# Patient Record
Sex: Female | Born: 1965 | Race: White | Hispanic: No | Marital: Single | Smoking: Former smoker
Health system: Southern US, Community
[De-identification: ages and names within clinical notes are randomized; demographics above are authoritative.]

## PROBLEM LIST (undated history)

## (undated) DIAGNOSIS — G35D Multiple sclerosis, unspecified: Secondary | ICD-10-CM

## (undated) DIAGNOSIS — I10 Essential (primary) hypertension: Secondary | ICD-10-CM

## (undated) DIAGNOSIS — G35 Multiple sclerosis: Secondary | ICD-10-CM

## (undated) DIAGNOSIS — G629 Polyneuropathy, unspecified: Secondary | ICD-10-CM

## (undated) DIAGNOSIS — H539 Unspecified visual disturbance: Secondary | ICD-10-CM

## (undated) HISTORY — PX: ULNAR NERVE TRANSPOSITION: SHX2595

## (undated) HISTORY — PX: OTHER SURGICAL HISTORY: SHX169

## (undated) HISTORY — DX: Polyneuropathy, unspecified: G62.9

## (undated) HISTORY — DX: Essential (primary) hypertension: I10

## (undated) HISTORY — PX: CARPAL TUNNEL RELEASE: SHX101

## (undated) HISTORY — PX: APPENDECTOMY: SHX54

## (undated) HISTORY — DX: Multiple sclerosis, unspecified: G35.D

## (undated) HISTORY — PX: KNEE SURGERY: SHX244

## (undated) HISTORY — DX: Unspecified visual disturbance: H53.9

## (undated) HISTORY — DX: Multiple sclerosis: G35

## (undated) HISTORY — PX: BLEPHAROPLASTY: SUR158

---

## 2011-09-17 DIAGNOSIS — H35359 Cystoid macular degeneration, unspecified eye: Secondary | ICD-10-CM | POA: Insufficient documentation

## 2015-07-04 DIAGNOSIS — Z79899 Other long term (current) drug therapy: Secondary | ICD-10-CM | POA: Insufficient documentation

## 2016-09-26 DIAGNOSIS — G894 Chronic pain syndrome: Secondary | ICD-10-CM | POA: Insufficient documentation

## 2016-10-30 ENCOUNTER — Encounter: Payer: Self-pay | Admitting: Neurology

## 2016-10-30 ENCOUNTER — Ambulatory Visit (INDEPENDENT_AMBULATORY_CARE_PROVIDER_SITE_OTHER): Payer: Medicare HMO | Admitting: Neurology

## 2016-10-30 VITALS — BP 108/64 | HR 68 | Resp 6 | Ht 68.0 in | Wt 179.5 lb

## 2016-10-30 DIAGNOSIS — R32 Unspecified urinary incontinence: Secondary | ICD-10-CM | POA: Insufficient documentation

## 2016-10-30 DIAGNOSIS — G894 Chronic pain syndrome: Secondary | ICD-10-CM | POA: Diagnosis not present

## 2016-10-30 DIAGNOSIS — Z79899 Other long term (current) drug therapy: Secondary | ICD-10-CM

## 2016-10-30 DIAGNOSIS — Z9889 Other specified postprocedural states: Secondary | ICD-10-CM | POA: Diagnosis not present

## 2016-10-30 DIAGNOSIS — F988 Other specified behavioral and emotional disorders with onset usually occurring in childhood and adolescence: Secondary | ICD-10-CM

## 2016-10-30 DIAGNOSIS — R269 Unspecified abnormalities of gait and mobility: Secondary | ICD-10-CM | POA: Diagnosis not present

## 2016-10-30 DIAGNOSIS — G35 Multiple sclerosis: Secondary | ICD-10-CM

## 2016-10-30 DIAGNOSIS — R5383 Other fatigue: Secondary | ICD-10-CM | POA: Insufficient documentation

## 2016-10-30 DIAGNOSIS — Z978 Presence of other specified devices: Secondary | ICD-10-CM | POA: Insufficient documentation

## 2016-10-30 DIAGNOSIS — H35359 Cystoid macular degeneration, unspecified eye: Secondary | ICD-10-CM

## 2016-10-30 MED ORDER — ROLLATOR MISC: 0 refills | Status: AC

## 2016-10-30 MED ORDER — METHYLPHENIDATE HCL 10 MG PO TABS: 10.0000 mg | ORAL_TABLET | Freq: Three times a day (TID) | ORAL | 0 refills | Status: AC

## 2016-10-30 MED ORDER — VITAMIN D (ERGOCALCIFEROL) 1.25 MG (50000 UNIT) PO CAPS: 50000.0000 [IU] | ORAL_CAPSULE | ORAL | 3 refills | Status: AC

## 2016-10-30 NOTE — Progress Notes (Signed)
[facility]  PATIENT: Sherry Vance DOB: Jan 15, 1966  REFERRING DOCTOR OR PCP:  Referred by Donnetta Simpers. PCP is [facility]. SOURCE: patient, notes from Dr. Hale Bogus, imaging and lab reports in PACS  _________________________________   HISTORICAL  CHIEF COMPLAINT:  Chief Complaint  Patient presents with  . Multiple Sclerosis    Sherry Vance is here with her dtr. [name] to transfer care of her MS to [facility].  Sts. she was dx. around 1998.  Dx. confirmed with MRI and LP. Believes she was initially on Avonex, stopped due to skin rxn's.  Thinks she tried Copaxone next, but relapsed and so was switched to Betaseron, but stopped due to progression of sx.  She switched to Aubagio around 2012.  Sts. her last MRI was done in the last few mos. at [facility].  She has cd with her today.  Sts. the sx. that bother  her the most are numbness/heaviness in  . Gait Disturbance    both legs, and burning/stinging bilat hands.  Sts. she is also forgetful.  She is ambulatory with a rolling walker today/fim    HISTORY OF PRESENT ILLNESS:  I had the pleasure seeing you patient, Sherry Vance, at [facility] for neurologic consultation regarding her multiple sclerosis.  In 1998, she had the onset of leg numbness and she saw Dr. Hale Bogus in [place]. MRI of the brain and staff was consistent with MS. She was placed on Copaxone but had skin reactions and switched to Avonex.   She relapsed and went on Betaseron but continued to relapse.   At one point, plasmapheresis was tried for a severe exacerbation.   She went on Novantrone for 2 years around 2004 and stopped when she reached her limit.    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    She has been on Aubagio x several years and feels she has been mostly stable but has noted worsening gait and memory.   She discussed Ocrelizumab with Dr. Hale Bogus but decided against it.     Gait/strength/sensation:   She has  a poor gait and uses a walker.  Around the house, she furniture surfs but uses the She notes tingling and numbness in both hands.     She has spasticity and weakness in both legs and has had a baclofen pump x 14-15 years.   Dose is  930 mcg/day  Vision:  She has difficulty with visio and has bene diagnosed with macular edema.   Due to a persistent sore on hr leg, her ophthalmologist does not want her to take a steroid treatment for the macular edema but one will be considered once it has healed.  Bladder:   She has urinary hesitancy and incontinence.   Ditropan helped a little but Myrbetriq did not.  She feels she does not empty She is scheduled to see urology next week.  Fatigue/sleep:   She is fatigued daily, especially in the afternoons.  She is sleepy in the late mornings and afternoons.  She is on Ritalin LA 30 mg with some benefit.  She sleeps well at night initially but then will wake up several times.   Usually she can fall back asleep.   She snores a little but has no OSA symptoms at night.    Cognition:   She is concerned about worsening, reduced focus short term memory and slowed processing.  This has worsened the past 1-2 years.   Headaches:   She reports headaches  that are in the bilateral temples lasting several minutes.  Pain is sharp and pressure like.  She keeps her eyes closed when pain is present and her daughter notes that she is sometimes disoriented.    She notes her memory is poor x 10-15 minutes with these headaches.    MRI Review:  I reviewed the MRI brain results from 01/23/2016, 05/31/2011 and 03/02/2010 and the cervical spine results from 09/13/2014 and 08/10/2013. She has classic lesions in the periventricular white matter as well as in the infratentorial region consistent with MS. The 2011 MRI showed 2 enhancing lesions. The MRI of the cervical spine shows several foci in the upper cervical spine consistent with MS. None of the foci enhanced.  Patient review the MRI of the  brain dated 05/31/2011. It shows multiple T2/FLAIR hyperintense foci in the periventricular, juxtacortical and deep white matter of both hemispheres. There is also a right middle cerebellar peduncle, right pons, several cerebellar hemispheric foci and a right medullary focus.  There is a small amount of enhancement in the right middle cerebellar peduncle focus.  I personally reviewed the images from the [facility] of the neck and head dated 06/22/2016. They are normal.  Other data review:   I reviewed the 05/17/2016 electromyogram/nerve conduction study. It shows an active L5 radiculopathy on the left.  REVIEW OF SYSTEMS: Constitutional: No fevers, chills, sweats, or change in appetite. Notes fatigue and poor sleep Eyes: No visual changes, double vision, eye pain Ear, nose and throat: No hearing loss, ear pain, nasal congestion, sore throat Cardiovascular: No chest pain, palpitations Respiratory: No shortness of breath at rest or with exertion.   No wheezes GastrointestinaI: No nausea, vomiting, diarrhea, abdominal pain, fecal incontinence Genitourinary: see above. Musculoskeletal: Chronic neck pain, back pain Integumentary: No rash, pruritus, skin lesions Neurological: as above Psychiatric: No depression at this time.  No anxiety.  Has  Attention deficit Endocrine: No palpitations, diaphoresis, change in appetite, change in weigh or increased thirst Hematologic/Lymphatic: No anemia, purpura, petechiae. Allergic/Immunologic: No itchy/runny eyes, nasal congestion, recent allergic reactions, rashes  ALLERGIES: Allergies  Allergen Reactions  . Metoclopramide Other (See Comments)    Blisters in back of throat Pharyngeal blisters    HOME MEDICATIONS:  Current Outpatient Prescriptions:  .  brimonidine-timolol (COMBIGAN) 0.2-0.5 % ophthalmic solution, Place 1 drop into both eyes every 12 (twelve) hours., Disp: , Rfl:  .  cycloSPORINE (RESTASIS) 0.05 % ophthalmic emulsion, , Disp: , Rfl:  .   cycloSPORINE (SANDIMMUNE) 25 MG capsule, Take by mouth., Disp: , Rfl:  .  furosemide (LASIX) 20 MG tablet, Take by mouth., Disp: , Rfl:  .  lisinopril (PRINIVIL,ZESTRIL) 10 MG tablet, take 1 tablet by mouth once daily, Disp: , Rfl:  .  Teriflunomide (AUBAGIO) 14 MG TABS, , Disp: , Rfl:  .  triamcinolone (KENALOG) 0.1 % paste, PLACE ONTO TEETH 2 TIMES DAILY, Disp: , Rfl:  .  methocarbamol (ROBAXIN) 500 MG tablet, Take by mouth., Disp: , Rfl:  .  methylphenidate (RITALIN) 10 MG tablet, Take 1 tablet (10 mg total) by mouth 3 (three) times daily., Disp: 270 tablet, Rfl: 0 .  Misc. Devices (ROLLATOR) MISC, One Rollator or similar walker.  MS (G35) and gait ataxia (R26.9), Disp: 1 each, Rfl: 0 .  Vitamin D, Ergocalciferol, (DRISDOL) 50000 units CAPS capsule, Take 1 capsule (50,000 Units total) by mouth every 7 (seven) days., Disp: 12 capsule, Rfl: 3  PAST MEDICAL HISTORY: Past Medical History:  Diagnosis Date  . Hypertension   .  Multiple sclerosis (HCC)   . Neuropathy (HCC)   . Vision abnormalities     PAST SURGICAL HISTORY: Past Surgical History:  Procedure Laterality Date  . APPENDECTOMY    . BLEPHAROPLASTY Bilateral   . CARPAL TUNNEL RELEASE Right   . implanted intrathecal pump    . KNEE SURGERY Left   . ULNAR NERVE TRANSPOSITION Right     FAMILY HISTORY: Family History  Problem Relation Age of Onset  . COPD Mother   . Diabetes Father     SOCIAL HISTORY:  Social History   Social History  . Marital status: Single    Spouse name: N/A  . Number of children: N/A  . Years of education: N/A   Occupational History  . Not on file.   Social History Main Topics  . Smoking status: Former Games developer  . Smokeless tobacco: Never Used  . Alcohol use No  . Drug use: No  . Sexual activity: Not on file   Other Topics Concern  . Not on file   Social History Narrative  . No narrative on file     PHYSICAL EXAM  Vitals:   10/30/16 1400  BP: 108/64  Pulse: 68  Resp: (!) 6    Weight: 179 lb 8 oz (81.4 kg)  Height: 5\' 8"  (1.727 m)    Body mass index is 27.29 kg/m.   General: The patient is well-developed and well-nourished and in no acute distress  Eyes:  Funduscopic exam shows normal optic discs and retinal vessels.  Neck: The neck is supple, no carotid bruits are noted.  The neck is nontender.  Cardiovascular: The heart has a regular rate and rhythm with a normal S1 and S2. There were no murmurs, gallops or rubs. Lungs are clear to auscultation.  Skin: Extremities are without significant edema.  Musculoskeletal:  Back is nontender  Neurologic Exam  Mental status: The patient is alert and oriented x 3 at the time of the examination. The patient has apparent normal recent and remote memory, with an apparently normal attention span and concentration ability.   Speech is normal.  Cranial nerves: Extraocular movements are full. Pupils are equal, round, and reactive to light and accomodation.  Visual fields are full.  Facial symmetry is present. There is good facial sensation to soft touch bilaterally.Facial strength is normal.  Trapezius and sternocleidomastoid strength is normal. No dysarthria is noted.  The tongue is midline, and the patient has symmetric elevation of the soft palate. No obvious hearing deficits are noted.  Motor:  Muscle bulk is normal.   Tone is increased in legs mildly . Strength is  4+ / 5 in legs.   Sensory: Sensory testing is intact to pinprick, soft touch and vibration sensation in the asrms but redcued vibration sensation in legs.  Coordination: Cerebellar testing reveals good finger-nose-finger and poor heel-to-shin bilaterally.  Gait and station: Station is normal.   Gait is spastic and wide and she cannot tandem walk.  Romberg is positive.   Reflexes: Deep tendon reflexes are symmetric and increased at knees.    Plantar responses are flexor.    DIAGNOSTIC DATA (LABS, IMAGING, TESTING) - I reviewed patient records, labs,  notes, testing and imaging myself where available.      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 No definite exacerbation recently, she feels she is doing worse. We discussed ocrelizumab and she is going to give that consideration.. Her current walker has a broken seat and I gave her a prescription for new one I will renew her Ritalin and switch over to the immediate release since it is better covered by her insurance. She will continue to try to be active and exercises as tolerated.     She will return to see me in 3 months or sooner if there are new or worsening neurologic symptoms. Thank you for asking me to see Sherry Vance for a neurologic consultation. Please let me know if I can be of further assistance with her or other patients in the future.  Dimitris Shanahan A. Epimenio Foot, MD, PhD 10/30/2016, 5:20 PM Certified in Neurology, Clinical Neurophysiology, Sleep Medicine, Pain Medicine and Neuroimaging  [facility] [address], [address] [place], [place] [address] [phone]

## 2017-02-21 ENCOUNTER — Ambulatory Visit: Payer: Medicare HMO | Admitting: Neurology

## 2017-09-20 ENCOUNTER — Other Ambulatory Visit: Payer: Self-pay | Admitting: Neurology

## 2021-06-28 ENCOUNTER — Emergency Department (INDEPENDENT_AMBULATORY_CARE_PROVIDER_SITE_OTHER): Payer: Medicare HMO

## 2021-06-28 ENCOUNTER — Emergency Department (INDEPENDENT_AMBULATORY_CARE_PROVIDER_SITE_OTHER)
Admission: EM | Admit: 2021-06-28 | Discharge: 2021-06-28 | Disposition: A | Discharge status: Home / Self Care | Payer: Medicare HMO | Source: Home / Self Care | Attending: Family Medicine | Admitting: Family Medicine

## 2021-06-28 DIAGNOSIS — M25441 Effusion, right hand: Secondary | ICD-10-CM

## 2021-06-28 DIAGNOSIS — M069 Rheumatoid arthritis, unspecified: Secondary | ICD-10-CM

## 2021-06-28 DIAGNOSIS — M79644 Pain in right finger(s): Secondary | ICD-10-CM | POA: Diagnosis not present

## 2021-06-28 DIAGNOSIS — M79641 Pain in right hand: Secondary | ICD-10-CM

## 2021-06-28 DIAGNOSIS — T1490XA Injury, unspecified, initial encounter: Secondary | ICD-10-CM

## 2021-06-28 DIAGNOSIS — Z8739 Personal history of other diseases of the musculoskeletal system and connective tissue: Secondary | ICD-10-CM | POA: Diagnosis not present

## 2021-06-28 MED ORDER — PREDNISONE 20 MG PO TABS: ORAL_TABLET | ORAL | 0 refills | Status: DC

## 2021-06-28 MED ORDER — PREDNISONE 20 MG PO TABS: ORAL_TABLET | ORAL | 0 refills | Status: AC
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
## 2021-06-28 NOTE — ED Notes (Addendum)
Radiology called upstairs to get OK to change xray to RT hand. Verbal OK given.

## 2021-06-28 NOTE — Discharge Instructions (Addendum)
No fracture identified Hand pain likely due to inflammation Continue usual medicines Add prednisone for a few days Follow up with PCP
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
## 2023-11-22 ENCOUNTER — Other Ambulatory Visit: Payer: Self-pay

## 2023-11-22 DIAGNOSIS — M25562 Pain in left knee: Secondary | ICD-10-CM | POA: Diagnosis present

## 2023-11-22 DIAGNOSIS — M7989 Other specified soft tissue disorders: Secondary | ICD-10-CM | POA: Diagnosis not present

## 2023-11-22 LAB — CBC WITH DIFFERENTIAL/PLATELET
Abs Immature Granulocytes: 0.01 10*3/uL (ref 0.00–0.07)
Basophils Absolute: 0.1 10*3/uL (ref 0.0–0.1)
Basophils Relative: 1 %
Eosinophils Absolute: 0.6 10*3/uL — ABNORMAL HIGH (ref 0.0–0.5)
Eosinophils Relative: 8 %
HCT: 33.2 % — ABNORMAL LOW (ref 36.0–46.0)
Hemoglobin: 10.5 g/dL — ABNORMAL LOW (ref 12.0–15.0)
Immature Granulocytes: 0 %
Lymphocytes Relative: 30 %
Lymphs Abs: 2.1 10*3/uL (ref 0.7–4.0)
MCH: 29 pg (ref 26.0–34.0)
MCHC: 31.6 g/dL (ref 30.0–36.0)
MCV: 91.7 fL (ref 80.0–100.0)
Monocytes Absolute: 0.5 10*3/uL (ref 0.1–1.0)
Monocytes Relative: 8 %
Neutro Abs: 3.7 10*3/uL (ref 1.7–7.7)
Neutrophils Relative %: 53 %
Platelets: 253 10*3/uL (ref 150–400)
RBC: 3.62 MIL/uL — ABNORMAL LOW (ref 3.87–5.11)
RDW: 13.4 % (ref 11.5–15.5)
WBC: 7 10*3/uL (ref 4.0–10.5)
nRBC: 0 % (ref 0.0–0.2)

## 2023-11-22 LAB — COMPREHENSIVE METABOLIC PANEL
ALT: 13 U/L (ref 0–44)
AST: 17 U/L (ref 15–41)
Albumin: 4.1 g/dL (ref 3.5–5.0)
Alkaline Phosphatase: 73 U/L (ref 38–126)
Anion gap: 11 (ref 5–15)
BUN: 19 mg/dL (ref 6–20)
CO2: 30 mmol/L (ref 22–32)
Calcium: 9.1 mg/dL (ref 8.9–10.3)
Chloride: 100 mmol/L (ref 98–111)
Creatinine, Ser: 0.66 mg/dL (ref 0.44–1.00)
GFR, Estimated: >60 mL/min (ref >60)
Glucose, Bld: 96 mg/dL (ref 70–99)
Potassium: 3.9 mmol/L (ref 3.5–5.1)
Sodium: 141 mmol/L (ref 135–145)
Total Bilirubin: 0.6 mg/dL (ref 0.0–1.2)
Total Protein: 6.8 g/dL (ref 6.5–8.1)

## 2023-11-22 NOTE — ED Triage Notes (Addendum)
Pt c/o left knee pain and left foot swelling- leg in knee brace boot due to foot surgery August 13 2023. Sensation in decreased- normal per pt- 2+ pitting edema noted, capp refill <3 seconds, skin is warm dry and intact. Steri strips noted on incision sites. No redness or discharge noted.

## 2023-11-23 ENCOUNTER — Emergency Department: Payer: Medicare HMO

## 2023-11-23 ENCOUNTER — Emergency Department
Admission: EM | Admit: 2023-11-23 | Discharge: 2023-11-23 | Disposition: A | Discharge status: Home / Self Care | Payer: Medicare HMO | Attending: Emergency Medicine | Admitting: Emergency Medicine

## 2023-11-23 DIAGNOSIS — M7989 Other specified soft tissue disorders: Secondary | ICD-10-CM

## 2023-11-23 DIAGNOSIS — M25562 Pain in left knee: Secondary | ICD-10-CM

## 2023-11-23 NOTE — Discharge Instructions (Signed)
 Your examination was generally reassuring.  Please read through the included information and follow-up with your podiatrist at the next available opportunity.  Return to the nearest emergency department if you develop new or worsening symptoms before you can follow-up with your podiatrist

## 2023-11-23 NOTE — ED Provider Notes (Signed)
 [facility] Provider Note    Event Date/Time   First MD Initiated Contact with Patient 11/23/23 0041     (approximate)   History   Knee Pain   HPI Sherry Vance is a 58 y.o. female who presents for evaluation of swelling in her left foot and some pain in her left knee.  She has a complicated history of prior surgery 21 years ago on her left knee but more recent surgery about 4 months ago on her left foot.  She sees a Risk analyst in [place].  She still has Steri-Strips in place from the surgery.  She has been using a knee scooter and believes that that is the reason she is having some point tenderness on her left kneecap.  There is no swelling or redness or inflammation.  She reports that she is been trying to be nonweightbearing since the surgery but over the last couple days has been walking more on her left foot.  She has left her boot in place for an extended period of time but has recently been taking it off even though it is difficult to do so for her.  She noticed the swelling over the last couple days as she has walked on it more.  She has had no fever, nausea, vomiting, chest pain, nor shortness of breath.     Physical Exam   Triage Vital Signs: ED Triage Vitals  Encounter Vitals Group     BP 11/22/23 2115 137/62     Systolic BP Percentile --      Diastolic BP Percentile --      Pulse Rate 11/22/23 2115 86     Resp 11/22/23 2115 18     Temp 11/22/23 2115 98.3 F (36.8 C)     Temp Source 11/22/23 2115 Oral     SpO2 11/22/23 2115 96 %     Weight 11/22/23 2119 57.6 kg (127 lb)     Height --      Head Circumference --      Peak Flow --      Pain Score 11/22/23 2119 2     Pain Loc --      Pain Education --      Exclude from Growth Chart --     Most recent vital signs: Vitals:   11/23/23 0035 11/23/23 0203  BP: 137/80 (!) 140/72  Pulse: 75 63  Resp:  18  Temp: 98.5 F (36.9 C)   SpO2: 100% 100%    General: Awake, no distress.   CV:  Good peripheral perfusion.  Regular rate and rhythm. Resp:  Normal effort. Speaking easily and comfortably, no accessory muscle usage nor intercostal retractions.   Abd:  No distention.  Other:  Normal-appearing left knee, no effusion, no erythema, no tenderness to palpation or with flexion/extension.  Patient has peripheral edema to the left foot and a small amount of edema that extends up past her ankle.  Though there is some swelling, compartments are still soft and easily compressible.  She has 2 small areas that appear consistent with abrasions, possibly from the pressure from her podiatric boot, but no evidence of active infection and no open wounds.  Mild tenderness to palpation all throughout the foot.  Foot is warm and well-perfused.  Her surgical wounds are closed and appear well-healed, and the Steri-Strips are still in place although they do not seem to be providing structural support for the wound.  There is no wound dehiscence and no surrounding  cellulitis.   ED Results / Procedures / Treatments   Labs (all labs ordered are listed, but only abnormal results are displayed) Labs Reviewed  CBC WITH DIFFERENTIAL/PLATELET - Abnormal; Notable for the following components:      Result Value   RBC 3.62 (*)    Hemoglobin 10.5 (*)    HCT 33.2 (*)    Eosinophils Absolute 0.6 (*)    All other components within normal limits  COMPREHENSIVE METABOLIC PANEL      RADIOLOGY I viewed and interpreted the patient's ultrasound and there is no evidence of DVT.  I also read the radiologist's report, which confirmed no acute findings.   PROCEDURES:  Critical Care performed: No  Procedures    IMPRESSION / MDM / ASSESSMENT AND PLAN / ED COURSE  I reviewed the triage vital signs and the nursing notes.                              Differential diagnosis includes, but is not limited to, nonspecific peripheral edema, poor venous return, DVT, acute infection, traumatic injury,  effusion.  Patient's presentation is most consistent with acute presentation with potential threat to life or bodily function.  Labs/studies ordered: Venous ultrasound of the left leg, CMP, CBC with differential  Interventions/Medications given:  Medications - No data to display  (Note:  hospital course my include additional interventions and/or labs/studies not listed above.)   Reassuring physical exam, vital signs normal, no acute abnormalities on lab work, no DVT on ultrasound.  No indication for x-rays as the patient has had no traumatic injury and it is very unlikely the x-rays would be of any clinical benefit.  I suspect she is having some increased swelling of that foot and leg because she has been ambulating on it more than usual rather than just using her knee scooter.  There is no indication she has an acute injury or infection of her left knee either.  I recommended that she follow-up with her podiatrist at the next available opportunity, preferably earlier, but she has an appointment within the next 2 weeks already scheduled.  I encouraged RICE and provided an Ace wrap.  She understands and agrees with the plan.         FINAL CLINICAL IMPRESSION(S) / ED DIAGNOSES   Final diagnoses:  Swelling of left foot  Acute pain of left knee     Rx / DC Orders   ED Discharge Orders     None        Note:  This document was prepared using Dragon voice recognition software and may include unintentional dictation errors.   Loleta Rose, MD 11/23/23 [phone]

## 2023-11-23 NOTE — ED Notes (Signed)
Pt given ice pack for knee pain

## 2024-08-27 NOTE — Progress Notes (Signed)
 DOB: [birth year]   Chief Complaint  Patient presents with  . Follow-up   Subjective   HISTORY: Sherry Vance is a 59 y.o.  female past medical history significant for baclofen pump, history of posterior instrumented fusion 2014 completed by Sherry Vance, fibromyalgia, chronic left knee pain status post knee replacement.  Patient was referred to [facility] by Sherry DELENA Flatness, MD PCP:  Sherry DELENA Flatness, MD  History of Present Illness Interval office history visit 08/27/2024 The patient presents for evaluation of her intrathecal pain pump.  She reports that her pain pump, which has been in place for 20 years, occasionally becomes displaced under her ribs or hip, resulting in discomfort. Despite these complications, she finds the pump efficacious and is hesitant to have it removed. She expresses concern about the potential for the pump to dislodge a rib during certain movements. Additionally, she notes that the pump can become trapped when she lifts her leg while seated. She has been managing this issue by wearing a brace and avoiding sudden movements. The patient has experienced a weight loss of approximately 60 pounds since the incident, which has mitigated the severity of the problem. The pump is managed by Sherry Vance, who has suggested the possibility of a smaller replacement device. She experiences mild dorsalgia and leg pain with the pump in situ but prefers not to increase the pump's setting. Her back pain is variable in location, occasionally affecting the cervical region. She is making an effort to maintain an upright posture as she tends to exhibit a slouched posture. She also reports pain in the thoracic cage. The patient is considering scheduling the removal of the pump in March 2026, coinciding with the battery replacement, but is interested in exploring alternative options first.   Interval office history visit 06/20/2023 Patient returns office with chief  complaints of aggravation and rotation of her baclofen pump located in her left lower abdominal wall.  She reports that she has lost over 40 pounds her baclofen pump continues to bother her worse with bending or sitting position.  She wants to inquire from Sherry Vance if she gains back some of her weight will her symptoms improve?  Reports she has baclofen and morphine currently in her pump managed by Sherry Simpers, PA of Sherry Vance office.  Reports her pump continues to provide her with significant pain relief.  Noted.  Medical assistant reports patient underwent seizing like activity during rooming.  Patient appear lethargic upon initiation of assessment.  Interval history visit 11/14/2022 Patient reports her back and left leg symptoms has progressively worsened but has not changed.  Patient states something does not feel right, every time she bends she feels like something is rubbing and making popping noises in her back.  Denies any increased back pain with range of motion or during popping noise.  Patient states she is not concerned that her instrumental fusion has possibly dislodge or broke.  Patient states she continues to experience lumbar back pain that radiates down her bilateral lower extremity left worse than right down her posterior buttocks to her posterior thigh and her posterior calf.  She states she has a baclofen pump that is managed by Sherry Vance.  States she has numbness tingling and pain at her medial anterior left knee and radiates down to her medial calf, but is aware she has been having the symptoms due to knee disease.  Currently, denies any new weakness, or bowel or bladder incontinence.  Denies any recent injections to her back as well as any recent physical therapy sessions.  Interval History- 09/04/2022 The patient presents to the office today for a follow-up for evaluation of pump  12/2021, patient had a baclofen pump revision post operative her pump was moved and now  is painful.   Now, she has gained weight and her pump is not as uncomfortable    Work Up: N/A Current Meds: Baclofen pump  Health Status  Allergies:  Allergies  Allergen Reactions  . Metoclopramide Rash    Blisters in back of throat Pharyngeal blisters  . Donepezil Hcl Nausea And Vomiting    Current medications: Medication List reviewed and updated today.    Current Home Medications  Medication Sig Last Dose  aspirin (ECOTRIN LOW DOSE) EC tablet Take one tablet (81 mg dose) by mouth every other day.   atorvastatin (LIPITOR) 20 mg tablet TAKE ONE TABLET BY MOUTH DAILY.   AUBAGIO 14 MG TABS Take one tablet (14 mg dose) by mouth every morning. QAM   celecoxib (CELEBREX) 200 mg capsule Take one capsule (200 mg dose) by mouth daily. Patient taking differently: Take one capsule (200 mg dose) by mouth daily as needed.   Cholecalciferol (VITAMIN D ) 125 MCG (5000 UT) CAPS Take 1 capsule by mouth daily.   clotrimazole (MYCELEX) 10 mg troche Dissolve 1 troche in the mouth two times weekly.   conjugated estrogens (PREMARIN) vaginal cream Place one g vaginally at bedtime.   diclofenac sodium (VOLTAREN) 1 % gel APPLY TOPICALLY TO THE AFFECTED AREA FOUR TIMES DAILY AS NEEDED   gabapentin (NEURONTIN) 100 mg capsule Take one capsule (100 mg dose) by mouth 2 (two) times daily. Patient taking differently: Take three capsules (300 mg dose) by mouth as needed.   lamoTRIgine (LAMICTAL) 100 mg tablet Take one tablet to two tablets (100-200 mg dose) by mouth 2 (two) times daily. 1 TAB QAM AND 2 TABS QHS   lisinopril (PRINIVIL,ZESTRIL) 10 mg tablet TAKE 1 TABLET EVERY DAY Patient taking differently: every 3 (three) days. TAKE 1 TABLET EVERY DAY   meloxicam (MOBIC) 15 mg tablet Take one tablet (15 mg dose) by mouth daily.   methocarbamol (ROBAXIN) 750 mg tablet Take one tablet (750 mg dose) by mouth 3 (three) times a day.   methylphenidate  HCl (RITALIN  SR,METADATE  ER) 20 mg ER tablet Take two tablets  (40 mg dose) by mouth every morning for 30 days. Not to fill before 02-10-20   mirabegron (MYRBETRIQ) 50 mg 24 hr tablet Take one tablet (50 mg dose) by mouth daily.   Misc. Devices (CERVICAL TRACTION) KIT Use 3-12 pounds of weight for 15 minutes once or twice a day, as tolerated.   Misc. Devices MISC Morphine 2.3mg /ml with Baclofen 2,000mcg/ml in 40ml NS for intrathecal pump refill-ICD-10-G35   Misc. Devices MISC [employer]. Devices MISC Dispense one shower chair   morphine sulfate (MSIR) 15 mg tablet Take one tablet (15 mg dose) by mouth every 6 (six) hours as needed.   nitrofurantoin macrocrystal (MACRODANTIN) 50 mg capsule Take one capsule (50 mg dose) by mouth every evening.   nystatin (MYCOSTATIN) 100000 UNIT/ML suspension One teaspoon swish and spit four times daily   ondansetron (ZOFRAN) 4 mg tablet Take one tablet (4 mg dose) by mouth every 8 (eight) hours as needed for Nausea.   OXYQUINOLONE SULFATE VAGINAL (TRIMO-SAN) 0.025 % GEL vaginal gel Place vaginally 3 (three) times a week.   pantoprazole sodium (PROTONIX) 40 mg tablet TAKE  1 TABLET BY MOUTH TWICE A DAY   rivastigmine (EXELON) 4.6 mg/24 hours Place one patch onto the skin every evening.   silver sulfADIAZINE (SILVADENE,SSD) 1% cream APPLY TO AFFECTED AREA TOPICALLY EVERY DAY   tacrolimus (PROGRAF) 1 mg capsule Take one capsule (1 mg dose) by mouth.   triamcinolone (KENALOG) 0.1% ointment APPLY TOPICALLY TO THE AFFECTED AREA TWICE DAILY       Problem list: Problem list reviewed and updated at today's visit Patient Active Problem List  Diagnosis  . Multiple sclerosis (*)  . Venous stasis  . Spondylolisthesis, grade 1  . Spondylolisthesis of lumbar region  . Adenomatous colon polyp  . Dyslipidemia  . Vitamin D  deficiency  . Bilateral dry eyes  . Chronic venous insufficiency  . Degenerative spondylolisthesis  . Epiretinal membrane, both eyes  . Fibromyalgia  . High risk medication use  . Intermediate uveitis  .  Pars planitis  . 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 Retinal edema  . Spasticity  . Ex-cigarette smoker  . Chronic pain syndrome  . Tongue lesion  . Recurrent UTI  . Neuromuscular scoliosis of lumbar region  . Chronic fatigue fibromyalgia syndrome  . 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 Protein S deficiency (*)  . Mixed incontinence  . Gait disturbance  . Pes planovalgus, acquired, left  . Acquired posterior equinus, left  . Posterior tibial tendon dysfunction (PTTD) of left lower extremity  . Presence of intrathecal baclofen pump  . Neurogenic bladder  . Presence of pessary  . Low serum vitamin B12  . Tenosynovitis of left hand  . Chronic pain of left knee  . Peripheral polyneuropathy  . Immunodeficiency due to drugs (*)  . S/P lumbar spinal fusion  . Rheumatoid arthritis (*)  . Neck pain  . Essential hypertension  . Unexplained weight loss  . Decreased functional activity tolerance  . Quadriceps weakness  . Disseminated chorioretinitis of both eyes  . Left ankle instability  . Pelvic pressure in female     Histories  Family History: family history was reviewed and updated today's visit, . Family History  Problem Relation Age of Onset  . Urolithiasis Father   . Colon polyps Father   . Kidney disease Mother   . COPD Mother   . Heart disease Mother   . Hypertension Mother   . Cancer Sister        Leukemia  . Kidney cancer Neg Hx   . Colon cancer Neg Hx      Social History: Social history was reviewed and updated today's visit.      Review of Systems:  A complete 13 point review of systems was performed and was noncontributory except as noted in history of present illness, see intake form from today's examination for full details Review of Systems  Constitutional:  Positive for activity change.  Cardiovascular:  Negative for chest pain, palpitations and leg swelling.  Gastrointestinal:  Negative for abdominal pain,  nausea and vomiting.  Musculoskeletal:  Positive for back pain.       Bilateral posterior buttocks, thigh, calf pain with numbness and tingling; left worse than right.  Psychiatric/Behavioral:  Negative for agitation, behavioral problems and confusion.     Objective   Vitals:   08/27/24 1500  BP: (!) 173/83  BP Location: Right Upper Arm  Patient Position: Sitting  Pulse: 103  Resp: 21  Temp: 99.7 F (37.6 C)  TempSrc: Temporal  SpO2: 95%  Weight: 116  lb (52.6 kg)  Height: 5' 8 (1.727 m)   Body mass index is 17.64 kg/m.  PHYSICAL EXAMINATION  CONSTITUTIONAL Well developed, well nourished and in no acute distress  HEAD & NECK Normocephalic and atraumatic.  Cervical Spine: Full ROM without pain   RESPIRATORY Lung fields clear to auscultation  CARDIOVASCULAR Heart rate regular without murmur, thrill or gallop  ABDOMEN No organomegaly, bruits, masses or tenderness  pump is located in the left lower quadrant  pump seems to ride on top of iliac crest but otherwise nromal   Rectal   BACK Lumbar Spine:   Figure 4: Negative /Positive Dorsalis Pedis Pulse: palpable bilaterally  EXTREMITY No trochanteric tenderness Full ROM shoulders bilaterally  NEUROLOGICAL      Mental Status Oriented to person, place, time and situation Memory: Normal Speech: Normal Cranial Nerves: Intact     Sensory Normal to pin prick     Motor Upper Ext                                           Left        Right Deltoid                              5/5         5/5 Biceps                              5/5          5/5 Triceps                             5/5          5/5 Extensor Carpi Radialis  5/5          5/5 Interossei                         5/5          5/5 Abductor Pollicis Brevis  5/5          5/5 Oppenens Pollicis           5/5           5/5     Motor Lower Ext                                             Left        Right Iliopsoas (HF)                   5/5        5/5         Quadriceps  (KE)         5/5      5/5          Tibialis Anticus (DF)          5/5      5/5 Extensor Hallucis Longus (DF)   5/5    5/5     Balance & Gait Random: Shuffling gait        Study Results  [facility] imaging lumbar x-ray  including flexion, extension, AP lateral to 11/14/2022  TECHNIQUE: XR SPINE LUMBAR MIN 4 VIEWS   INDICATION: s/p lumbar fusion with new increase back and leg pain   COMPARISON: None CT the abdomen and pelvis from 08/11/2021.   FINDINGS: There are five lumbar type vertebral bodies. There is severe thoracolumbar levocurvature which is similar to the prior exam. Rod and screw fixation hardware with interbody spacers at L3-L5; the hardware is intact and there is no definite surrounding lucency. There is approximately 11 mm of anterolisthesis of L5 and S1 which is similar to the prior exam it does not definitely change between flexion or extension.. There is severe disc space narrowing with endplate sclerosis which has progressed. There is severe right lateral disc space narrowing at L2-L3 which is similar. No osseous fracture identified. The bones appear osteopenic. Lumbar catheter is in place. Aortic calcifications.      IMPRESSION: 1.  There is grade 2 anterolisthesis of L5 and S1 which is similar to the prior CT scan. No dynamic instability. 2.  Intact L3-L5 fusion hardware. 3.  Severe L5-S1 degenerative disc disease which has progressed from 2022.  Impression   1. Irritation of abdominal wall secondary to baclofen pump.         Disposition   Discussed my diagnosis and recommendations with patient in great detail .  Discussed indications, limitations, complications, and alternatives to surgery.  The [employer] representative will show the patient the smaller pump. If the patient elects to proceed with replacement using the smaller baclofen pump, we will move forward and implant it in the right lower abdomen. Message sent to rep.

## 2024-09-06 ENCOUNTER — Emergency Department
Admission: EM | Admit: 2024-09-06 | Discharge: 2024-09-06 | Disposition: A | Discharge status: Home / Self Care | Attending: Emergency Medicine | Admitting: Emergency Medicine

## 2024-09-06 ENCOUNTER — Other Ambulatory Visit: Payer: Self-pay

## 2024-09-06 ENCOUNTER — Emergency Department

## 2024-09-06 DIAGNOSIS — I10 Essential (primary) hypertension: Secondary | ICD-10-CM | POA: Insufficient documentation

## 2024-09-06 DIAGNOSIS — R519 Headache, unspecified: Secondary | ICD-10-CM | POA: Insufficient documentation

## 2024-09-06 DIAGNOSIS — R350 Frequency of micturition: Secondary | ICD-10-CM | POA: Insufficient documentation

## 2024-09-06 DIAGNOSIS — G35D Multiple sclerosis, unspecified: Secondary | ICD-10-CM | POA: Diagnosis not present

## 2024-09-06 DIAGNOSIS — R531 Weakness: Secondary | ICD-10-CM | POA: Diagnosis not present

## 2024-09-06 LAB — CBC
HCT: 41.1 % (ref 36.0–46.0)
Hemoglobin: 12.7 g/dL (ref 12.0–15.0)
MCH: 28.8 pg (ref 26.0–34.0)
MCHC: 30.9 g/dL (ref 30.0–36.0)
MCV: 93.2 fL (ref 80.0–100.0)
Platelets: 253 K/uL (ref 150–400)
RBC: 4.41 MIL/uL (ref 3.87–5.11)
RDW: 14.2 % (ref 11.5–15.5)
WBC: 4.2 K/uL (ref 4.0–10.5)
nRBC: 0 % (ref 0.0–0.2)

## 2024-09-06 LAB — URINALYSIS, COMPLETE (UACMP) WITH MICROSCOPIC
Bacteria, UA: NONE SEEN
Bilirubin Urine: NEGATIVE
Glucose, UA: NEGATIVE mg/dL
Hgb urine dipstick: NEGATIVE
Ketones, ur: NEGATIVE mg/dL
Leukocytes,Ua: NEGATIVE
Nitrite: NEGATIVE
Protein, ur: NEGATIVE mg/dL
RBC / HPF: 0 RBC/hpf (ref 0–5)
Specific Gravity, Urine: 1.01 (ref 1.005–1.030)
pH: 6 (ref 5.0–8.0)

## 2024-09-06 LAB — COMPREHENSIVE METABOLIC PANEL WITH GFR
ALT: 20 U/L (ref 0–44)
AST: 29 U/L (ref 15–41)
Albumin: 4.5 g/dL (ref 3.5–5.0)
Alkaline Phosphatase: 82 U/L (ref 38–126)
Anion gap: 9 (ref 5–15)
BUN: 10 mg/dL (ref 6–20)
CO2: 34 mmol/L — ABNORMAL HIGH (ref 22–32)
Calcium: 9.1 mg/dL (ref 8.9–10.3)
Chloride: 101 mmol/L (ref 98–111)
Creatinine, Ser: 0.54 mg/dL (ref 0.44–1.00)
GFR, Estimated: >60 mL/min (ref >60)
Glucose, Bld: 103 mg/dL — ABNORMAL HIGH (ref 70–99)
Potassium: 3.8 mmol/L (ref 3.5–5.1)
Sodium: 143 mmol/L (ref 135–145)
Total Bilirubin: 0.3 mg/dL (ref 0.0–1.2)
Total Protein: 7 g/dL (ref 6.5–8.1)

## 2024-09-06 LAB — RESP PANEL BY RT-PCR (RSV, FLU A&B, COVID)  RVPGX2
Influenza A by PCR: NEGATIVE
Influenza B by PCR: NEGATIVE
Resp Syncytial Virus by PCR: NEGATIVE
SARS Coronavirus 2 by RT PCR: NEGATIVE

## 2024-09-06 MED ORDER — ONDANSETRON HCL 4 MG/2ML IJ SOLN
4.0000 mg | Freq: Once | INTRAMUSCULAR | Status: AC
  Administered 2024-09-06: 4 mg via INTRAVENOUS
  Filled 2024-09-06: qty 2

## 2024-09-06 MED ORDER — KETOROLAC TROMETHAMINE 30 MG/ML IJ SOLN
30.0000 mg | Freq: Once | INTRAMUSCULAR | Status: AC
  Administered 2024-09-06: 30 mg via INTRAVENOUS
  Filled 2024-09-06: qty 1

## 2024-09-06 MED ORDER — SODIUM CHLORIDE 0.9 % IV BOLUS
1000.0000 mL | Freq: Once | INTRAVENOUS | Status: AC
  Administered 2024-09-06: 1000 mL via INTRAVENOUS

## 2024-09-06 MED ORDER — GADOBUTROL 1 MMOL/ML IV SOLN
5.0000 mL | Freq: Once | INTRAVENOUS | Status: AC | PRN
  Administered 2024-09-06: 5 mL via INTRAVENOUS

## 2024-09-06 NOTE — ED Notes (Signed)
 Patients daughter called ER and RN asked patient if giving out information was okay and patient stated not to.

## 2024-09-06 NOTE — ED Provider Notes (Addendum)
 [facility] Provider Note    Event Date/Time   First MD Initiated Contact with Patient 09/06/24 [id number]     (approximate)  History   Chief Complaint: Headache  HPI  Sherry Vance is a 58 y.o. female with a past medical history of MS, hypertension, neuropathy, presents to the emergency department for headache.  Per EMS patient has a history of MS with cognitive impairment, is currently visiting family in the area and has reported head pain and family was concerned for possible urinary tract infection.  Per EMS family reports the patient has been urinating frequently.  Patient denies any dysuria.  Patient also complains of head pains however when asked about this she states it is not a headache but more so sensitivity to loud noise or light.  I reviewed the patient's most recent charts from [facility], patient appears to have a history of chronic pain, fibromyalgia, baclofen pump.  Awaiting family arrival for further history.  Physical Exam   Triage Vital Signs: ED Triage Vitals  Encounter Vitals Group     BP 09/06/24 0947 (!) 157/77     Girls Systolic BP Percentile --      Girls Diastolic BP Percentile --      Boys Systolic BP Percentile --      Boys Diastolic BP Percentile --      Pulse Rate 09/06/24 0947 60     Resp 09/06/24 0947 17     Temp --      Temp src --      SpO2 09/06/24 0947 100 %     Weight --      Height 09/06/24 0949 5' 8 (1.727 m)     Head Circumference --      Peak Flow --      Pain Score 09/06/24 0949 0     Pain Loc --      Pain Education --      Exclude from Growth Chart --     Most recent vital signs: Vitals:   09/06/24 0947  BP: (!) 157/77  Pulse: 60  Resp: 17  SpO2: 100%    General: Awake, no distress.  CV:  Good peripheral perfusion.  Regular rate and rhythm  Resp:  Normal effort.  Equal breath sounds bilaterally.  No obvious wheeze rales or rhonchi. Abd:  No distention.  Soft, nontender.  No rebound or guarding.   Appears to have a benign abdomen.  ED Results / Procedures / Treatments   MEDICATIONS ORDERED IN ED: Medications  sodium chloride 0.9 % bolus 1,000 mL (has no administration in time range)  ketorolac (TORADOL) 30 MG/ML injection 30 mg (has no administration in time range)  ondansetron (ZOFRAN) injection 4 mg (has no administration in time range)     IMPRESSION / MDM / ASSESSMENT AND PLAN / ED COURSE  I reviewed the triage vital signs and the nursing notes.  Patient's presentation is most consistent with acute presentation with potential threat to life or bodily function.  Patient presents to the emergency department for headache and urinary frequency.  EMS reports family was concerned for possible urinary tract infection.  Here the patient is awake she keeps her eyes closed for most of the exam but answers to questions that you ask her.  Patient's answers are often very long and will often get distracted while answering a question.  Is unclear what her baseline is awaiting family arrival for further history.  However given the reported head discomfort, light  and sound sensitivity we will check labs, given the urinary frequency we will obtain a urinalysis.  Will IV hydrate and treat with Toradol while waiting for the results and history.  Patient's workup is overall reassuring CBC is normal, chemistry is normal, urinalysis is normal.  Respiratory panel is negative.  EKG shows no concerning findings.  Patient states she is feeling better.  Temperature was initially reading as low however we rechecked an oral temperature currently 97.9.  Patient is under a Lawyer but had not been under very long when we rechecked the temperature and it appears to have largely normalized.  Given the patient's otherwise reassuring workup and as she is feeling better I believe the patient would be safe for discharge home with outpatient follow-up and strict return precautions.  Patient is agreeable to this plan as  well.  Patient now states she would like an MRI performed.  Patient states she has MS and feels like the weakness is more than her typical weakness.  She goes on to tell me that she is always complaining to her family and they are not wanting to help her get up and down to the bathroom.  I discussed with the patient we will proceed with an MRI to ensure she does not have an active MS lesion or new lesions.  However if the MRI does not show any concerning findings I believe the patient would still be safe for discharge home.  Patient is agreeable to this plan.  MRI has resulting showing patchy and confluent areas of abnormal T2 and FLAIR signal of both cerebral hemispheres consistent with multiple sclerosis no lesions show contrast-enhancement, which would suggest no active lesions.  Given no active lesions and otherwise negative workup again I believe the patient will be safe for discharge home with outpatient follow-up.  Patient agreeable.  FINAL CLINICAL IMPRESSION(S) / ED DIAGNOSES   Headache Weakness  Note:  This document was prepared using Dragon voice recognition software and may include unintentional dictation errors.   Dorothyann Drivers, MD 09/06/24 1253    Dorothyann Drivers, MD 09/06/24 1308    Dorothyann Drivers, MD 09/06/24 1500

## 2024-09-06 NOTE — ED Notes (Signed)
 Bair hugger applied.

## 2024-09-06 NOTE — ED Triage Notes (Signed)
 Visiting daughter, does not live with her. Pt been complaining of a bad headache since this AM before getting up for church. Dizziness, weakness, sounds hurt her head. Hx of MS and seizures but does not take any meds. Daughter said that she has not slept for 2 days. Daughter says pt has a med pump for morphine and baclofen. Stand up with assistance, negative .  Patient aOX4, CO of headache that changed to weakness and lightheaded.   60, 98, 207/93, 77CBG.

## 2024-09-06 NOTE — Discharge Instructions (Addendum)
 Please drink plenty of fluids.  Please follow-up with your doctor within the next 1 to 2 days for recheck/reevaluation.  Return to the emergency department for any significant weakness any fever, or any other symptom personally concerning to yourself.  Your workup today has shown largely normal results.

## 2024-09-06 NOTE — ED Notes (Signed)
 Patient stated that she didn't need the medications that the MD ordered. Patient educated on why meds were ordered for her and the pain/dizziness. Patient verbally stated that she was okay with RN administering them. Attempted to place patient on toilet and she stated that she couldn't get up due to the pain. Bedpan offered and patient stated it was painful to roll over or lay down. Patient still states she is not in any pain after RN re-assessed patient's pain. RN gave patient call button and instructed her to call staff when she needed to urinate. AOX4 at this time.
